# Patient Record
Sex: Female | Born: 1990 | Race: Black or African American | Hispanic: No | Marital: Single | Smoking: Never smoker
Health system: Southern US, Community
[De-identification: ages and names within clinical notes are randomized; demographics above are authoritative.]

## PROBLEM LIST (undated history)

## (undated) DIAGNOSIS — J45909 Unspecified asthma, uncomplicated: Secondary | ICD-10-CM

## (undated) DIAGNOSIS — N946 Dysmenorrhea, unspecified: Secondary | ICD-10-CM

## (undated) DIAGNOSIS — F319 Bipolar disorder, unspecified: Secondary | ICD-10-CM

## (undated) DIAGNOSIS — F32A Depression, unspecified: Secondary | ICD-10-CM

## (undated) HISTORY — DX: Depression, unspecified: F32.A

## (undated) HISTORY — DX: Dysmenorrhea, unspecified: N94.6

## (undated) HISTORY — PX: OTHER SURGICAL HISTORY: SHX169

## (undated) HISTORY — DX: Bipolar disorder, unspecified: F31.9

## (undated) HISTORY — DX: Unspecified asthma, uncomplicated: J45.909

---

## 2020-11-12 ENCOUNTER — Encounter: Payer: Self-pay | Admitting: Neurology

## 2020-12-08 ENCOUNTER — Ambulatory Visit: Payer: Self-pay | Admitting: Neurology

## 2020-12-31 ENCOUNTER — Ambulatory Visit: Payer: Medicaid Other | Admitting: Neurology

## 2021-01-03 ENCOUNTER — Ambulatory Visit: Payer: Medicaid Other | Admitting: Podiatry

## 2021-01-12 ENCOUNTER — Ambulatory Visit: Payer: Medicaid Other | Admitting: Podiatry

## 2021-01-17 ENCOUNTER — Ambulatory Visit: Payer: Medicaid Other | Admitting: Neurology

## 2021-01-20 ENCOUNTER — Ambulatory Visit (INDEPENDENT_AMBULATORY_CARE_PROVIDER_SITE_OTHER): Payer: Medicaid Other | Admitting: Neurology

## 2021-01-20 ENCOUNTER — Other Ambulatory Visit: Payer: Self-pay

## 2021-01-20 ENCOUNTER — Encounter: Payer: Self-pay | Admitting: Neurology

## 2021-01-20 VITALS — BP 100/70 | HR 78 | Ht 64.0 in | Wt 240.0 lb

## 2021-01-20 DIAGNOSIS — R4689 Other symptoms and signs involving appearance and behavior: Secondary | ICD-10-CM | POA: Diagnosis not present

## 2021-01-20 DIAGNOSIS — R404 Transient alteration of awareness: Secondary | ICD-10-CM

## 2021-01-20 DIAGNOSIS — R292 Abnormal reflex: Secondary | ICD-10-CM | POA: Diagnosis not present
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
## 2021-01-20 NOTE — Patient Instructions (Signed)
Good to meet you!  Schedule head CT without contrast  2. Schedule EEG  3. Our office will call with results, if normal, follow-up as needed

## 2021-01-31 ENCOUNTER — Other Ambulatory Visit: Payer: Self-pay

## 2021-01-31 ENCOUNTER — Ambulatory Visit: Payer: Medicaid Other

## 2021-01-31 ENCOUNTER — Ambulatory Visit (INDEPENDENT_AMBULATORY_CARE_PROVIDER_SITE_OTHER): Payer: Medicaid Other | Admitting: Podiatry

## 2021-01-31 ENCOUNTER — Ambulatory Visit (INDEPENDENT_AMBULATORY_CARE_PROVIDER_SITE_OTHER): Payer: Medicaid Other

## 2021-01-31 DIAGNOSIS — M7750 Other enthesopathy of unspecified foot: Secondary | ICD-10-CM

## 2021-01-31 DIAGNOSIS — R2241 Localized swelling, mass and lump, right lower limb: Secondary | ICD-10-CM | POA: Diagnosis not present

## 2021-01-31 NOTE — Progress Notes (Signed)
  Subjective:  Patient ID: Kelly Schroeder, female    DOB: [birth year],  MRN: [id number]  Chief Complaint  Patient presents with   Foot Pain    New pt- mass of right foot    30 y.o. female presents with the above complaint. History confirmed with patient.  She presents today for evaluation of a mass on the right foot, referred by her PCP feels like a bone spurs.  She is here with a caregiver.  Patient is a relatively poor historian.  She says it hurts with or without shoes  Objective:  Physical Exam: warm, good capillary refill, no trophic changes or ulcerative lesions, normal DP and PT pulses, and normal sensory exam. Left Foot: normal exam, no swelling, tenderness, instability; ligaments intact, full range of motion of all ankle/foot joints Right Foot: Painful hard mobile lesion over the extensor tendon to the fifth toe on the MTPJ level  No images are attached to the encounter.  Radiographs: Multiple views x-ray of the right foot: Increased radiodensity at the level of the mass as noted by skin marker Assessment:   1. Mass of right foot      Plan:  Patient was evaluated and treated and all questions answered.  Mass is quite firm not sure that this is actually a ganglion cyst.  I recommend imaging with an MRI to evaluate for possible surgical intervention.  Differential also includes a schwannoma or neurovascular tumor.  She will follow-up after the MRI for reevaluation and planning  Return for after MRI to review (Call to schedule).

## 2021-02-11 ENCOUNTER — Ambulatory Visit: Payer: Medicaid Other

## 2021-02-16 ENCOUNTER — Ambulatory Visit (INDEPENDENT_AMBULATORY_CARE_PROVIDER_SITE_OTHER): Payer: Medicaid Other | Admitting: Neurology

## 2021-02-16 ENCOUNTER — Other Ambulatory Visit: Payer: Self-pay

## 2021-02-16 DIAGNOSIS — R292 Abnormal reflex: Secondary | ICD-10-CM | POA: Diagnosis not present

## 2021-02-16 DIAGNOSIS — R4689 Other symptoms and signs involving appearance and behavior: Secondary | ICD-10-CM

## 2021-02-16 DIAGNOSIS — R404 Transient alteration of awareness: Secondary | ICD-10-CM

## 2021-02-18 ENCOUNTER — Ambulatory Visit
Admission: RE | Admit: 2021-02-18 | Discharge: 2021-02-18 | Disposition: A | Discharge status: Home / Self Care | Payer: Medicaid Other | Source: Ambulatory Visit | Attending: Neurology | Admitting: Neurology

## 2021-02-18 ENCOUNTER — Other Ambulatory Visit: Payer: Self-pay

## 2021-02-18 DIAGNOSIS — R404 Transient alteration of awareness: Secondary | ICD-10-CM

## 2021-02-18 DIAGNOSIS — R292 Abnormal reflex: Secondary | ICD-10-CM

## 2021-02-18 DIAGNOSIS — R4689 Other symptoms and signs involving appearance and behavior: Secondary | ICD-10-CM

## 2021-02-21 ENCOUNTER — Encounter
Admission: RE | Admit: 2021-02-21 | Discharge: 2021-02-21 | Disposition: A | Discharge status: Home / Self Care | Payer: Medicaid Other | Source: Ambulatory Visit | Attending: Podiatry | Admitting: Podiatry

## 2021-02-21 ENCOUNTER — Other Ambulatory Visit: Payer: Self-pay

## 2021-02-21 ENCOUNTER — Telehealth: Payer: Self-pay

## 2021-02-21 DIAGNOSIS — R2241 Localized swelling, mass and lump, right lower limb: Secondary | ICD-10-CM | POA: Insufficient documentation

## 2021-02-21 MED ORDER — GADOBUTROL 1 MMOL/ML IV SOLN
10.0000 mL | Freq: Once | INTRAVENOUS | Status: AC | PRN
  Administered 2021-02-21: 10 mL via INTRAVENOUS

## 2021-02-21 NOTE — Telephone Encounter (Signed)
Spoke with pt caregiver they asked for pt CT report to faxed to [phone] to [facility]

## 2021-02-21 NOTE — Telephone Encounter (Signed)
-----   Message from Alda Berthold, DO sent at 02/18/2021 12:16 PM EST ----- Please inform pt that CT head does not show any abnormalities to explain symptoms.

## 2021-03-03 ENCOUNTER — Ambulatory Visit: Payer: Medicaid Other | Admitting: Podiatry

## 2021-03-08 NOTE — Procedures (Signed)
ELECTROENCEPHALOGRAM REPORT  Date of Study: 02/16/2021  Patient's Name: Kelly Schroeder MRN: [id number] Date of Birth: 27-Sep-1990  Referring Provider: Dr. Ellouise Newer  Clinical History: This is a 30 year old woman with staring spells and behavioral changes, mother concerned they are seizures. EEG for classification.  Medications: VENTOLIN HFA 108 (90 Base) MCG/ACT inhaler NEXPLANON 68 MG IMPL implant ABILIFY 2 MG tablet ADVIL 800 MG tablet CLARITIN 10 MG tablet DESYREL 50 MG tablet  Technical Summary: A multichannel digital EEG recording measured by the international 10-20 system with electrodes applied with paste and impedances below 5000 ohms performed in our laboratory with EKG monitoring in an awake and asleep patient.  Hyperventilation was not performed. Photic stimulation was performed.  The digital EEG was referentially recorded, reformatted, and digitally filtered in a variety of bipolar and referential montages for optimal display.    Description: The patient is awake and asleep during the recording.  During maximal wakefulness, there is a symmetric, medium voltage 8 Hz posterior dominant rhythm that attenuates with eye opening.  The record is symmetric.  During drowsiness and stage I sleep, there is an increase in theta slowing of the background with vertex waves seen.  Photic stimulation did not elicit any abnormalities.  There were no epileptiform discharges or electrographic seizures seen.    EKG lead was unremarkable.  Impression: This awake and asleep EEG is normal.    Clinical Correlation: A normal EEG does not exclude a clinical diagnosis of epilepsy.  If further clinical questions remain, prolonged EEG may be helpful.  Clinical correlation is advised.   Ellouise Newer, M.D.

## 2021-03-16 ENCOUNTER — Encounter: Payer: Self-pay | Admitting: Podiatry

## 2021-03-16 ENCOUNTER — Ambulatory Visit: Payer: Medicaid Other | Admitting: Podiatry

## 2021-03-16 ENCOUNTER — Other Ambulatory Visit: Payer: Self-pay

## 2021-03-16 DIAGNOSIS — D492 Neoplasm of unspecified behavior of bone, soft tissue, and skin: Secondary | ICD-10-CM

## 2021-03-16 DIAGNOSIS — D3610 Benign neoplasm of peripheral nerves and autonomic nervous system, unspecified: Secondary | ICD-10-CM

## 2021-03-18 ENCOUNTER — Telehealth: Payer: Self-pay

## 2021-03-18 NOTE — Telephone Encounter (Signed)
-----   Message from Cameron Sprang, MD sent at 03/17/2021  9:54 AM EST ----- Pls let caregiver know the brainwave test was normal, thanks

## 2021-03-18 NOTE — Telephone Encounter (Signed)
Pt caregiver called no answer per DPR left a voice mail  the brainwave test was normal

## 2021-03-21 NOTE — Progress Notes (Signed)
°  Subjective:  Patient ID: Kelly Schroeder, female    DOB: [birth year],  MRN: [id number]  Chief Complaint  Patient presents with   Mass    Right foot   mri follow up    31 y.o. female presents with the above complaint. History confirmed with patient.  She presents today for evaluation of a mass on the right foot, referred by her PCP feels like a bone spurs.  She is here with a caregiver.  Patient is a relatively poor historian.  She says it hurts with or without shoes   Interval history: She completed the MRI and is here for review Objective:  Physical Exam: warm, good capillary refill, no trophic changes or ulcerative lesions, normal DP and PT pulses, and normal sensory exam. Left Foot: normal exam, no swelling, tenderness, instability; ligaments intact, full range of motion of all ankle/foot joints Right Foot: Painful hard mobile lesion over the extensor tendon to the fifth toe on the MTPJ level  No images are attached to the encounter.  Radiographs: Multiple views x-ray of the right foot: Increased radiodensity at the level of the mass as noted by skin marker  MRI right foot 02/22/2021 IMPRESSION: 1. Smoothly marginated and well circumscribed 19.5 x 13.0 x 10.0 mm solid enhancing subcutaneous soft tissue mass overlying the dorsal aspect of the fourth metatarsal shaft. This most likely reflects a nerve sheath tumor (schwannoma or neurofibroma). No involvement of the underlying foot musculature or fourth metatarsal cortex. 2. No other significant findings.   Assessment:   1. Benign tumor of peripheral, sympathetic, and parasympathetic nerves and ganglia      Plan:  Patient was evaluated and treated and all questions answered.  MRI confirms the diagnosis is likely a peripheral nerve tumor.  I discussed multiple treatment options including nonsurgical treatment with just monitoring it and living with the condition, or excisional biopsy.  Seems to be symptomatic for her and  limiting her ADLs.  We discussed the option of excision.  The caregiver with her today is not her legal guardian, they will return for follow-up when her legal guardian is able and plan for surgical excision.  Return for surgery planning visit .

## 2021-03-28 ENCOUNTER — Ambulatory Visit: Payer: Medicaid Other | Admitting: Podiatry

## 2021-03-30 ENCOUNTER — Encounter: Payer: Self-pay | Admitting: Podiatry

## 2021-03-30 ENCOUNTER — Ambulatory Visit (INDEPENDENT_AMBULATORY_CARE_PROVIDER_SITE_OTHER): Payer: Medicaid Other | Admitting: Podiatry

## 2021-03-30 ENCOUNTER — Other Ambulatory Visit: Payer: Self-pay

## 2021-03-30 DIAGNOSIS — D3610 Benign neoplasm of peripheral nerves and autonomic nervous system, unspecified: Secondary | ICD-10-CM

## 2021-03-30 DIAGNOSIS — D492 Neoplasm of unspecified behavior of bone, soft tissue, and skin: Secondary | ICD-10-CM | POA: Diagnosis not present

## 2021-03-30 NOTE — Progress Notes (Signed)
°  Subjective:  Patient ID: Kelly Schroeder, female    DOB: 1990/10/25,  MRN: [id number]  Chief Complaint  Patient presents with   Foot Pain    "We're here to discuss surgery."    31 y.o. female presents with the above complaint. History confirmed with patient.  She presents today for evaluation of a mass on the right foot, referred by her PCP feels like a bone spurs.  She is here with a caregiver.  Patient is a relatively poor historian.  She says it hurts with or without shoes   Interval history: She returns today for surgical planning visit with her legal guardian Objective:  Physical Exam: warm, good capillary refill, no trophic changes or ulcerative lesions, normal DP and PT pulses, and normal sensory exam. Left Foot: normal exam, no swelling, tenderness, instability; ligaments intact, full range of motion of all ankle/foot joints Right Foot: Painful hard mobile lesion over the extensor tendon to the fifth toe on the MTPJ level  No images are attached to the encounter.  Radiographs: Multiple views x-ray of the right foot: Increased radiodensity at the level of the mass as noted by skin marker  MRI right foot 02/22/2021 IMPRESSION: 1. Smoothly marginated and well circumscribed 19.5 x 13.0 x 10.0 mm solid enhancing subcutaneous soft tissue mass overlying the dorsal aspect of the fourth metatarsal shaft. This most likely reflects a nerve sheath tumor (schwannoma or neurofibroma). No involvement of the underlying foot musculature or fourth metatarsal cortex. 2. No other significant findings.   Assessment:   1. Benign tumor of peripheral, sympathetic, and parasympathetic nerves and ganglia      Plan:  Patient was evaluated and treated and all questions answered.  Again discussed the proposed surgery of excision of the painful tumor or mass with her caregiver as well as legal guardian is present today.  We discussed the risk benefits and potential complications of the  surgery including but not limited to pain, swelling, infection, scar, numbness which may be temporary or permanent, chronic pain, stiffness, nerve pain or damage, wound healing problems.  I also discussed this with the patient and she indicated understanding.  The goal of surgery would be to remove the mass and improve pain and function and obtain a biopsy of the specimen to elucidate if the mass is indeed benign which it appears to be on her MRI.  They understand and wish to proceed.  No guarantees were given.  Informed consent was signed and reviewed.  Surgery will be scheduled at a mutually agreeable date.   Surgical plan:  Procedure: -Excision of mass of right foot  Location: -[facility]  Anesthesia plan: -IV sedation with local field block  Postoperative pain plan: -Percocet 5 mg / 325 mg every 6 hours as needed, ibuprofen 600 mg every 6 hours  DVT prophylaxis: -None required  WB Restrictions / DME needs: -WBAT in surgical shoe that we will dispense at surgery center   Return for after surgery .

## 2021-05-18 ENCOUNTER — Telehealth: Payer: Self-pay

## 2021-05-18 NOTE — Telephone Encounter (Signed)
Received a call from J[place] legal guardian. She stated Kelly Schroeder has a UTI and was out on antibiotics 05/17/2021. We have rescheduled her surgery with Dr. Sherryle Lis from 05/20/2021 to 06/03/2021. Notified Dr. Sherryle Lis and Caren Griffins with [place] ?

## 2021-05-25 ENCOUNTER — Encounter: Payer: Medicaid Other | Admitting: Podiatry

## 2021-06-03 ENCOUNTER — Other Ambulatory Visit: Payer: Self-pay | Admitting: Podiatry

## 2021-06-03 DIAGNOSIS — D3610 Benign neoplasm of peripheral nerves and autonomic nervous system, unspecified: Secondary | ICD-10-CM

## 2021-06-03 DIAGNOSIS — D492 Neoplasm of unspecified behavior of bone, soft tissue, and skin: Secondary | ICD-10-CM | POA: Diagnosis not present

## 2021-06-03 DIAGNOSIS — G5761 Lesion of plantar nerve, right lower limb: Secondary | ICD-10-CM | POA: Diagnosis not present

## 2021-06-03 MED ORDER — IBUPROFEN 600 MG PO TABS: 600.0000 mg | ORAL_TABLET | Freq: Four times a day (QID) | ORAL | 0 refills | Status: AC | PRN

## 2021-06-03 MED ORDER — GABAPENTIN 300 MG PO CAPS: 300.0000 mg | ORAL_CAPSULE | Freq: Three times a day (TID) | ORAL | 0 refills | Status: DC

## 2021-06-03 MED ORDER — ACETAMINOPHEN 500 MG PO TABS: 1000.0000 mg | ORAL_TABLET | Freq: Four times a day (QID) | ORAL | 0 refills | Status: AC | PRN

## 2021-06-03 MED ORDER — OXYCODONE HCL 5 MG PO TABS: 5.0000 mg | ORAL_TABLET | ORAL | 0 refills | Status: AC | PRN

## 2021-06-03 NOTE — Progress Notes (Signed)
06/03/21 excision nerve tumor  ?

## 2021-06-08 ENCOUNTER — Other Ambulatory Visit: Payer: Self-pay

## 2021-06-08 ENCOUNTER — Ambulatory Visit (INDEPENDENT_AMBULATORY_CARE_PROVIDER_SITE_OTHER): Payer: Medicaid Other | Admitting: Podiatry

## 2021-06-08 ENCOUNTER — Encounter: Payer: Medicaid Other | Admitting: Podiatry

## 2021-06-08 ENCOUNTER — Telehealth: Payer: Self-pay | Admitting: Podiatry

## 2021-06-08 DIAGNOSIS — D3610 Benign neoplasm of peripheral nerves and autonomic nervous system, unspecified: Secondary | ICD-10-CM

## 2021-06-08 NOTE — Telephone Encounter (Signed)
Patients care giver called and needs to know when patient can resume going back to [place]?  Can she go back now or wait until after her next follow up appointment?  She will need a note to give to them with return date and any restrictions.

## 2021-06-08 NOTE — Progress Notes (Signed)
?  Subjective:  ?Patient ID: Kelly Schroeder, female    DOB: 01-28-1991,  MRN: [id number] ? ?Chief Complaint  ?Patient presents with  ? Routine Post Op  ?    POV #1 DOS 06/03/2021 EXCISION OF MASS OF RT FOOT  ? ? ?31 y.o. female returns for post-op check.  Doing well not having much pain ? ?Review of Systems: Negative except as noted in the HPI. Denies N/V/F/Ch. ? ? ?Objective:  ?There were no vitals filed for this visit. ?There is no height or weight on file to calculate BMI. ?Constitutional Well developed. ?Well nourished.  ?Vascular Foot warm and well perfused. ?Capillary refill normal to all digits.  Calf is soft and supple, no posterior calf or knee pain, negative Homans' sign  ?Neurologic Normal speech. ?Oriented to person, place, and time. ?Epicritic sensation to light touch grossly present bilaterally.  ?Dermatologic Skin healing well without signs of infection. Skin edges well coapted without signs of infection.  ?Orthopedic: Tenderness to palpation noted about the surgical site.  ? ? ?Assessment:  ? ?1. Benign tumor of peripheral, sympathetic, and parasympathetic nerves and ganglia   ? ?Plan:  ?Patient was evaluated and treated and all questions answered. ? ?S/p foot surgery right ?-Progressing as expected post-operatively. ?-WB Status: WBAT in surgical shoe ?-Sutures: Removed next visit. ?-Medications: No refills required ?-Foot redressed. ? ?Return in about 2 weeks (around 06/22/2021) for post op (no x-rays), suture removal.  ?

## 2021-06-13 ENCOUNTER — Encounter: Payer: Self-pay | Admitting: Podiatry

## 2021-06-20 ENCOUNTER — Encounter: Payer: Self-pay | Admitting: *Deleted

## 2021-06-20 ENCOUNTER — Ambulatory Visit (INDEPENDENT_AMBULATORY_CARE_PROVIDER_SITE_OTHER): Payer: Medicaid Other | Admitting: Podiatry

## 2021-06-20 ENCOUNTER — Encounter: Payer: Medicaid Other | Admitting: Podiatry

## 2021-06-20 DIAGNOSIS — D3613 Benign neoplasm of peripheral nerves and autonomic nervous system of lower limb, including hip: Secondary | ICD-10-CM

## 2021-06-25 NOTE — Progress Notes (Signed)
?  Subjective:  ?Patient ID: Kelly Schroeder, female    DOB: 07-07-1990,  MRN: [id number] ? ?Chief Complaint  ?Patient presents with  ? Routine Post Op  ?    POV #2 DOS 06/03/2021 EXCISION OF MASS OF RT FOOT / SUTURE REMOVAL  ? ? ?31 y.o. female returns for post-op check.  Doing well not having much pain ? ?Review of Systems: Negative except as noted in the HPI. Denies N/V/F/Ch. ? ? ?Objective:  ?There were no vitals filed for this visit. ?There is no height or weight on file to calculate BMI. ?Constitutional Well developed. ?Well nourished.  ?Vascular Foot warm and well perfused. ?Capillary refill normal to all digits.  Calf is soft and supple, no posterior calf or knee pain, negative Homans' sign  ?Neurologic Normal speech. ?Oriented to person, place, and time. ?Epicritic sensation to light touch grossly present bilaterally.  ?Dermatologic Skin healing well without signs of infection. Skin edges well coapted without signs of infection.  ?Orthopedic: Tenderness to palpation noted about the surgical site.  ? ?Pathology results with schwannoma with complete excision ? ?Assessment:  ? 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 ?Plan:  ?Patient was evaluated and treated and all questions answered. ? ?S/p foot surgery right ?-Sutures removed today.  Pathology results show it was a schwannoma.  Hopefully does not recur.  Discussed with him I do expect she will have numbness here for some time.  Likely forever.  Return to regular shoe gear and activities as tolerated.  Return as needed ? ?Return if symptoms worsen or fail to improve.  ?

## 2021-06-29 ENCOUNTER — Encounter: Payer: Medicaid Other | Admitting: Podiatry

## 2021-07-13 ENCOUNTER — Encounter: Payer: Medicaid Other | Admitting: Podiatry

## 2021-09-06 ENCOUNTER — Telehealth: Payer: Self-pay | Admitting: Obstetrics and Gynecology

## 2021-09-06 NOTE — Telephone Encounter (Signed)
Called and left voicemail for patient to call back to be scheduled. 

## 2021-09-06 NOTE — Telephone Encounter (Signed)
[facility] referring for Having dysmennorhea. bleeding "all the time" has nexplanon in place since 2021. Paper records. Sch any providers.

## 2021-09-13 ENCOUNTER — Encounter: Payer: Self-pay | Admitting: Obstetrics

## 2021-09-13 ENCOUNTER — Other Ambulatory Visit (HOSPITAL_COMMUNITY)
Admission: RE | Admit: 2021-09-13 | Discharge: 2021-09-13 | Disposition: A | Discharge status: Home / Self Care | Payer: Medicaid Other | Source: Ambulatory Visit | Attending: Obstetrics | Admitting: Obstetrics

## 2021-09-13 ENCOUNTER — Ambulatory Visit (INDEPENDENT_AMBULATORY_CARE_PROVIDER_SITE_OTHER): Payer: Medicaid Other | Admitting: Obstetrics

## 2021-09-13 VITALS — BP 122/80 | Wt 244.8 lb

## 2021-09-13 DIAGNOSIS — Z124 Encounter for screening for malignant neoplasm of cervix: Secondary | ICD-10-CM

## 2021-09-13 DIAGNOSIS — Z975 Presence of (intrauterine) contraceptive device: Secondary | ICD-10-CM

## 2021-09-13 DIAGNOSIS — Z113 Encounter for screening for infections with a predominantly sexual mode of transmission: Secondary | ICD-10-CM | POA: Diagnosis not present

## 2021-09-13 DIAGNOSIS — Z6841 Body Mass Index (BMI) 40.0 and over, adult: Secondary | ICD-10-CM

## 2021-09-13 DIAGNOSIS — N921 Excessive and frequent menstruation with irregular cycle: Secondary | ICD-10-CM | POA: Diagnosis not present

## 2021-09-13 DIAGNOSIS — R1031 Right lower quadrant pain: Secondary | ICD-10-CM

## 2021-09-13 DIAGNOSIS — Z1151 Encounter for screening for human papillomavirus (HPV): Secondary | ICD-10-CM | POA: Insufficient documentation

## 2021-09-13 MED ORDER — LO LOESTRIN FE 1 MG-10 MCG / 10 MCG PO TABS: 1.0000 | ORAL_TABLET | Freq: Every day | ORAL | 0 refills | Status: DC

## 2021-09-15 LAB — CYTOLOGY - PAP
Comment: NEGATIVE
Diagnosis: NEGATIVE
High risk HPV: NEGATIVE

## 2021-09-17 LAB — NUSWAB VAGINITIS PLUS (VG+)
Candida albicans, NAA: NEGATIVE
Candida glabrata, NAA: NEGATIVE
Chlamydia trachomatis, NAA: NEGATIVE
Neisseria gonorrhoeae, NAA: NEGATIVE
Trich vag by NAA: NEGATIVE

## 2021-09-26 ENCOUNTER — Ambulatory Visit
Admission: RE | Admit: 2021-09-26 | Discharge: 2021-09-26 | Disposition: A | Discharge status: Home / Self Care | Payer: Medicaid Other | Source: Ambulatory Visit | Attending: Obstetrics | Admitting: Obstetrics

## 2021-09-26 ENCOUNTER — Other Ambulatory Visit: Payer: Self-pay | Admitting: Obstetrics

## 2021-09-26 DIAGNOSIS — R1031 Right lower quadrant pain: Secondary | ICD-10-CM | POA: Insufficient documentation

## 2021-09-26 DIAGNOSIS — N921 Excessive and frequent menstruation with irregular cycle: Secondary | ICD-10-CM | POA: Insufficient documentation

## 2021-09-26 DIAGNOSIS — Z975 Presence of (intrauterine) contraceptive device: Secondary | ICD-10-CM | POA: Diagnosis present

## 2021-09-30 ENCOUNTER — Ambulatory Visit (INDEPENDENT_AMBULATORY_CARE_PROVIDER_SITE_OTHER): Payer: Medicaid Other | Admitting: Family Medicine

## 2021-09-30 ENCOUNTER — Encounter: Payer: Self-pay | Admitting: Family Medicine

## 2021-09-30 VITALS — BP 132/82 | Wt 243.0 lb

## 2021-09-30 DIAGNOSIS — D259 Leiomyoma of uterus, unspecified: Secondary | ICD-10-CM

## 2021-09-30 DIAGNOSIS — Z975 Presence of (intrauterine) contraceptive device: Secondary | ICD-10-CM

## 2021-09-30 DIAGNOSIS — N921 Excessive and frequent menstruation with irregular cycle: Secondary | ICD-10-CM

## 2021-09-30 MED ORDER — MULTIVITAMIN WOMEN PO TABS: 1.0000 | ORAL_TABLET | Freq: Every day | ORAL | 12 refills | Status: DC

## 2021-09-30 MED ORDER — LO LOESTRIN FE 1 MG-10 MCG / 10 MCG PO TABS: 1.0000 | ORAL_TABLET | Freq: Every day | ORAL | 2 refills | Status: DC

## 2021-09-30 NOTE — Progress Notes (Unsigned)
   GYNECOLOGY PROBLEM  VISIT ENCOUNTER NOTE  Subjective:   Telicia Hodgkiss is a 31 y.o. G0P0000 female here for a problem GYN visit.  Current complaints: here to follow [place].   Denies abnormal vaginal bleeding, discharge, pelvic pain, problems with intercourse or other gynecologic concerns.    Gynecologic History No LMP recorded. Patient has had an implant.  Contraception: Nexplanon  Health Maintenance Due  Topic Date Due   COVID-19 Vaccine (1) Never done   HIV Screening  Never done   Hepatitis C Screening  Never done   TETANUS/TDAP  06/12/2017    The following portions of the patient's history were reviewed and updated as appropriate: allergies, current medications, past family history, past medical history, past social history, past surgical history and problem list.  Review of Systems Pertinent items are noted in HPI.   Objective:  BP 132/82   Wt 243 lb (110.2 kg)   BMI 41.71 kg/m  Gen: well appearing, NAD HEENT: no scleral icterus CV: RR Lung: Normal WOB Ext: warm well perfused     Assessment and Plan:   1. Breakthrough bleeding on Nexplanon - Reviewed trial of pills to stabilize endometrium.  - Will try add back estrogen pill for 3 months. - Norethindrone-Ethinyl Estradiol-Fe Biphas (LO LOESTRIN FE) 1 MG-10 MCG / 10 MCG tablet; Take 1 tablet by mouth daily.  Dispense: 30 tablet; Refill: 2 - Multiple Vitamins-Minerals (MULTIVITAMIN WOMEN) TABS; Take 1 tablet by mouth daily.  Dispense: 30 tablet; Refill: 12  2. 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    Please refer to After Visit Summary for other counseling recommendations.   No follow-ups on file.  Caren Macadam, MD, MPH, ABFM Attending [place] for [facility]

## 2021-10-03 ENCOUNTER — Encounter: Payer: Self-pay | Admitting: Family Medicine

## 2021-12-13 ENCOUNTER — Telehealth: Payer: Self-pay

## 2021-12-13 ENCOUNTER — Ambulatory Visit (INDEPENDENT_AMBULATORY_CARE_PROVIDER_SITE_OTHER): Payer: Medicaid Other | Admitting: Obstetrics and Gynecology

## 2021-12-13 ENCOUNTER — Encounter: Payer: Self-pay | Admitting: Obstetrics and Gynecology

## 2021-12-13 VITALS — BP 100/80 | Ht 65.0 in | Wt 246.0 lb

## 2021-12-13 DIAGNOSIS — Z975 Presence of (intrauterine) contraceptive device: Secondary | ICD-10-CM

## 2021-12-13 DIAGNOSIS — Z3041 Encounter for surveillance of contraceptive pills: Secondary | ICD-10-CM

## 2021-12-13 DIAGNOSIS — N921 Excessive and frequent menstruation with irregular cycle: Secondary | ICD-10-CM | POA: Diagnosis not present

## 2021-12-13 MED ORDER — NORETHINDRONE ACET-ETHINYL EST 1-20 MG-MCG PO TABS: 1.0000 | ORAL_TABLET | Freq: Every day | ORAL | 3 refills | Status: DC

## 2021-12-13 NOTE — Telephone Encounter (Signed)
Yes, Rx is different intentionally

## 2021-12-13 NOTE — Telephone Encounter (Signed)
[place] notified.

## 2021-12-13 NOTE — Progress Notes (Signed)
Coolidge Breeze, FNP   Chief Complaint  Patient presents with  . Follow-up    Heavy vag bleeding on nexplanon    HPI:      [name] is a 31 y.o. G0P0000 whose LMP was No LMP recorded. Patient has had an implant., presents again today for AUB with nexplanon. Nexplanon placed 10/09/19. Initially didn't have bleeding. Has had bleeding off and on past few months. Lives in group home and brought to appt by caregivers.  Seen 6/23 for bleeding, started on Lo Loestrin and GYN u/s orderd.  7/23 u/s showed 3.9 cm exophytic leio on GYN u/s 7/23, EM=3.3 mm. BTB resolved with Lo Loestrin at time of 7/23 f/u appt. Started bleeding today per caregivers (noted on diaper), no dsymen. Also had some bleeding last month. Pt is on 3rd day of new pill pack.  Hx of menometrorrhagia since menarche, used to be on depo with occas light bleeding but changed to nexplanon due to wt gain.   Patient denies history of migraine headaches with aura, uncontrolled hypertension, liver disease or blood clots. Neg pap/neg HPV DNA, neg STD testing 6/23  Past Medical History:  Diagnosis Date  . Asthma   . Bipolar 1 disorder ([place])   . Depression   . Dysmenorrhea    Past Surgical History:  Procedure Laterality Date  . OTHER SURGICAL HISTORY     Tumor right leg    History reviewed. No pertinent family history.  Social History   Socioeconomic History  . Marital status: Single    Spouse name: Not on file  . Number of children: Not on file  . Years of education: Not on file  . Highest education level: Not on file  Occupational History  . Not on file  Tobacco Use  . Smoking status: Never  . Smokeless tobacco: Never  Vaping Use  . Vaping Use: Never used  Substance and Sexual Activity  . Alcohol use: Never  . Drug use: Never  . Sexual activity: Not on file  Other Topics Concern  . Not on file  Social History Narrative   Right handed    Group home [facility]    Social  Determinants of Health   Financial Resource Strain: Not on file  Food Insecurity: Not on file  Transportation Needs: Not on file  Physical Activity: Not on file  Stress: Not on file  Social Connections: Not on file  Intimate Partner Violence: Not on file    Outpatient Medications Prior to Visit  Medication Sig Dispense Refill  . albuterol (VENTOLIN HFA) 108 (90 Base) MCG/ACT inhaler Inhale into the lungs every 6 (six) hours as needed for wheezing or shortness of breath.    . ARIPiprazole (ABILIFY) 2 MG tablet 1 tablet    . etonogestrel (NEXPLANON) 70 MG IMPL implant See admin instructions.    Marland Kitchen ibuprofen (ADVIL) 800 MG tablet 1 tablet Orally Three times a day prn for 30 days    . loratadine (CLARITIN) 10 MG tablet 1 tablet    . Multiple Vitamins-Minerals (MULTIVITAMIN WOMEN) TABS Take 1 tablet by mouth daily. 30 tablet 12  . nystatin cream (MYCOSTATIN) 1 application Externally three times a day for 7 days    . traZODone (DESYREL) 50 MG tablet Take 50 mg by mouth at bedtime.    . Norethindrone-Ethinyl Estradiol-Fe Biphas (LO LOESTRIN FE) 1 MG-10 MCG / 10 MCG tablet Take 1 tablet by mouth daily. 30 tablet 2  . gabapentin (NEURONTIN)  300 MG capsule Take 1 capsule (300 mg total) by mouth 3 (three) times daily for 7 days. 21 capsule 0   No facility-administered medications prior to visit.      ROS:  Review of Systems  Constitutional:  Negative for fever.  Gastrointestinal:  Negative for blood in stool, constipation, diarrhea, nausea and vomiting.  Genitourinary:  Positive for vaginal bleeding. Negative for dyspareunia, dysuria, flank pain, frequency, hematuria, urgency, vaginal discharge and vaginal pain.  Musculoskeletal:  Negative for back pain.  Skin:  Negative for rash.   BREAST: No symptoms   OBJECTIVE:   Vitals:  BP 100/80   Ht '5\' 5"'$  (1.651 m)   Wt 246 lb (111.6 kg)   BMI 40.94 kg/m   Physical Exam Vitals reviewed.  Constitutional:      Appearance: She is  well-developed.  Pulmonary:     Effort: Pulmonary effort is normal.  Musculoskeletal:        General: Normal range of motion.     Cervical back: Normal range of motion.  Skin:    General: Skin is warm and dry.  Neurological:     General: No focal deficit present.     Mental Status: She is alert and oriented to person, place, and time.     Cranial Nerves: No cranial nerve deficit.  Psychiatric:        Mood and Affect: Mood normal.        Behavior: Behavior normal.        Thought Content: Thought content normal.        Judgment: Judgment normal.    Assessment/Plan: Breakthrough bleeding on Nexplanon - Plan: norethindrone-ethinyl estradiol (LOESTRIN 1/20, 21,) 1-20 MG-MCG tablet; AUB improved with Lo Lo OCPs but having BTB again, few days after starting new pill pack. Question related to placebo pills. Recommended cont dosing of pills to control bleeding. To make it easier for staff, complete current pills (don't take 4 placebo pills) and then change to Loestrin 1/20 21 day pill pack for cont dosing. Rx eRxd. F/u prn. If sx persist, will try aygestin instead.  Discussed sx most likely hormonal given neg pap/STD testing/GYN u/s for bleeding.   Encounter for surveillance of contraceptive pills - Plan: norethindrone-ethinyl estradiol (LOESTRIN 1/20, 21,) 1-20 MG-MCG tablet    Meds ordered this encounter  Medications  . norethindrone-ethinyl estradiol (LOESTRIN 1/20, 21,) 1-20 MG-MCG tablet    Sig: Take 1 tablet by mouth daily. CONTINUOUS DOSING    Dispense:  63 tablet    Refill:  3    Order Specific Question:   Supervising Provider    Answer:   Rubie Maid [[id number]]      Return if symptoms worsen or fail to improve.  Romualdo Prosise B. Sanchez Hemmer, PA-C 12/13/2021 2:37 PM

## 2021-12-13 NOTE — Telephone Encounter (Signed)
Caryl Pina from [employer] calling; bc dose is different than prev order; is this correct?  [phone]

## 2021-12-19 ENCOUNTER — Ambulatory Visit: Payer: Medicaid Other | Admitting: Podiatry

## 2021-12-19 ENCOUNTER — Encounter: Payer: Self-pay | Admitting: Podiatry

## 2021-12-19 DIAGNOSIS — L600 Ingrowing nail: Secondary | ICD-10-CM | POA: Diagnosis not present

## 2021-12-19 NOTE — Patient Instructions (Signed)
If the issue returns, an in office procedure to remove the nail may be necessary.

## 2021-12-22 NOTE — Progress Notes (Signed)
  Subjective:  Patient ID: Kelly Schroeder, female    DOB: 1990/05/30,  MRN: [id number]  Chief Complaint  Patient presents with   Nail Problem    "They said has an ingrown toenail." N - ingrown toenail L - hallux rt D - couple of weeks O - gradually got worse C - sharp pain A - shoe T - nurse for Dr. Merlene Morse clipped it some    31 y.o. female presents with the above complaint. History confirmed with patient.  This is a new issue.  The schwannoma excision continues to do well  Objective:  Physical Exam: warm, good capillary refill, no trophic changes or ulcerative lesions, normal DP and PT pulses, normal sensory exam, and mild ingrowing lateral right hallux border no signs of infection or paronychia.  Assessment:   1. Ingrowing right great toenail      Plan:  Patient was evaluated and treated and all questions answered.  We reviewed treatment options for ingrowing nail.  She had a relatively mild ingrowing nail today.  Did not require permanent partial matricectomy.  We discussed the option of this in the future if it returns or worsens.  Would require her POA to be present for consent signature.  Return if symptoms worsen or fail to improve.

## 2022-01-26 ENCOUNTER — Telehealth: Payer: Self-pay

## 2022-01-26 NOTE — Telephone Encounter (Signed)
[facility] called for pt reporting bleeding after being on BC pills. Then she states she remember the Dr said Tito Dine may be one of the pts that still has a period while on [facility]. She seem to not be worried and would call us if any concerns.

## 2022-06-06 ENCOUNTER — Other Ambulatory Visit: Payer: Self-pay | Admitting: Family Medicine

## 2022-06-06 DIAGNOSIS — R7989 Other specified abnormal findings of blood chemistry: Secondary | ICD-10-CM

## 2022-06-06 DIAGNOSIS — R319 Hematuria, unspecified: Secondary | ICD-10-CM

## 2022-06-14 ENCOUNTER — Ambulatory Visit
Admission: RE | Admit: 2022-06-14 | Discharge: 2022-06-14 | Disposition: A | Discharge status: Home / Self Care | Payer: Medicaid Other | Source: Ambulatory Visit | Attending: Family Medicine | Admitting: Family Medicine

## 2022-06-14 DIAGNOSIS — R7989 Other specified abnormal findings of blood chemistry: Secondary | ICD-10-CM

## 2022-06-14 DIAGNOSIS — R319 Hematuria, unspecified: Secondary | ICD-10-CM | POA: Diagnosis not present

## 2022-09-07 ENCOUNTER — Other Ambulatory Visit: Payer: Self-pay | Admitting: Obstetrics and Gynecology

## 2022-09-07 DIAGNOSIS — Z3041 Encounter for surveillance of contraceptive pills: Secondary | ICD-10-CM

## 2022-09-07 DIAGNOSIS — N921 Excessive and frequent menstruation with irregular cycle: Secondary | ICD-10-CM

## 2022-10-17 ENCOUNTER — Other Ambulatory Visit: Payer: Self-pay | Admitting: Family Medicine

## 2023-04-12 IMAGING — MR MR FOOT*R* WO/W CM
9 series · 40 of 40 positions shown · IV contrast (gadavist)
Comparison: Radiographs 01/31/2021

CLINICAL DATA: Right foot mass for 3 months.

EXAM:
MRI OF THE RIGHT FOREFOOT WITHOUT AND WITH CONTRAST
TECHNIQUE: Multiplanar, multisequence MR imaging of the right foot was
performed before and after the administration of intravenous
contrast.
CONTRAST:  10mL GADAVIST GADOBUTROL 1 MMOL/ML IV SOLN

[Series 4: T1 · coronal · right · 3.0mm · 0.38mm/px · 5 of 45 slices shown (1 of 2)]
[im 1/45]
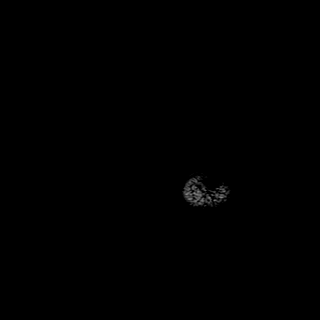
[im 12/45]
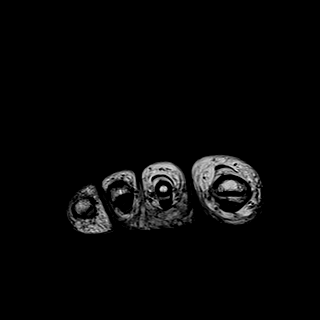
[im 23/45]
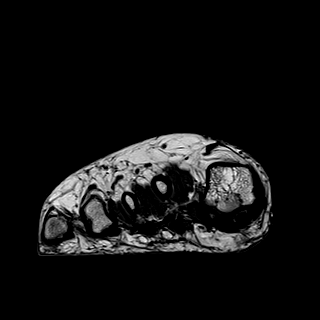
[im 34/45]
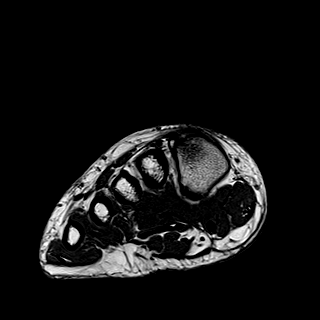
[im 45/45]
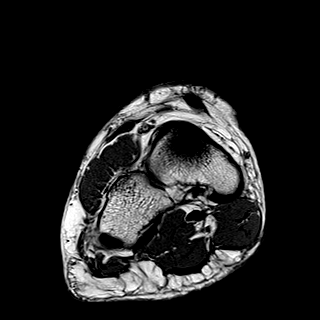

[Series 6: T2 · coronal · right · 3.0mm · 0.50mm/px · 6 of 45 slices shown (1 of 2)]
[im 1/45]
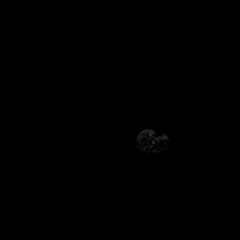
[im 9/45]
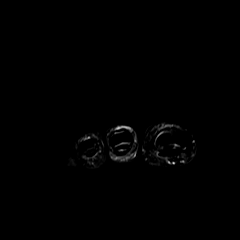
[im 18/45]
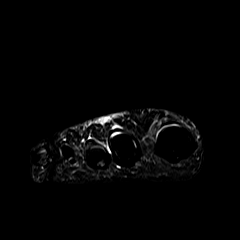
[im 27/45]
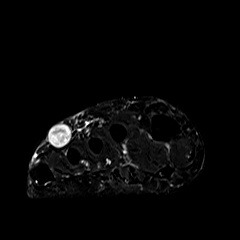
[im 36/45]
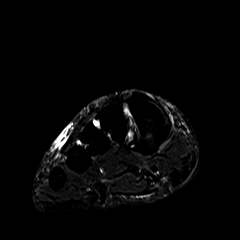
[im 45/45]
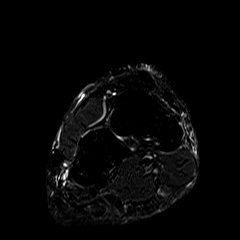

[Series 7: T1 · axial · right · 3.0mm · 0.70mm/px · z∈[-123,-51]mm · 3 of 20 slices shown (2 of 2)]
[im 1/20]
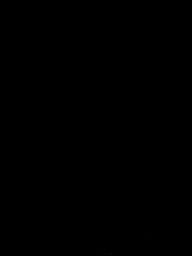
[im 10/20]
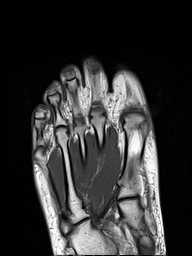
[im 20/20]
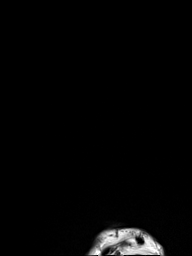

[Series 9: T2 · axial · right · 3.0mm · 0.70mm/px · z∈[-119,-51]mm · 3 of 19 slices shown (2 of 2)]
[im 1/19]
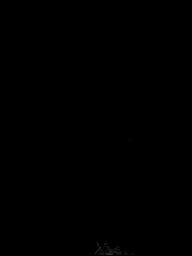
[im 10/19]
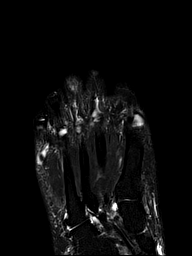
[im 19/19]
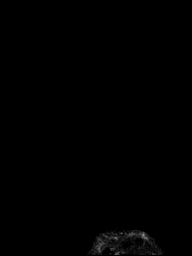

[Series 10: STIR · sagittal · right · 3.0mm · 0.62mm/px · 4 of 29 slices shown]
[im 1/29]
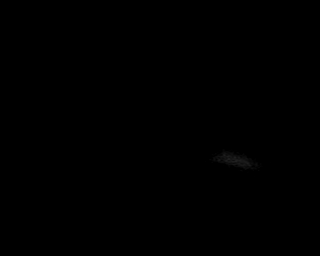
[im 10/29]
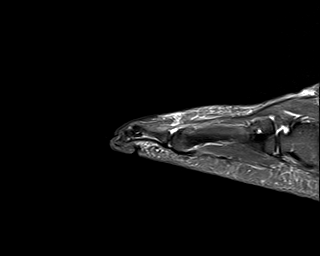
[im 19/29]
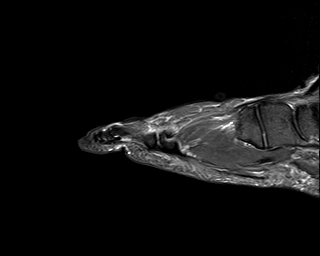
[im 29/29]
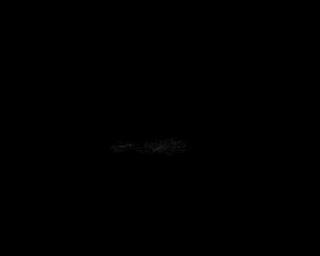

[Series 11: T1 fat-sat · coronal · non-contrast · right · 3.0mm · 0.47mm/px · 6 of 45 slices shown (1 of 3)]
[im 1/45]
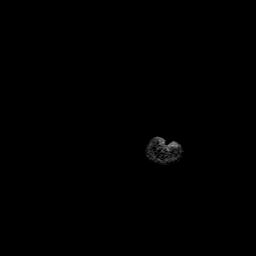
[im 9/45]
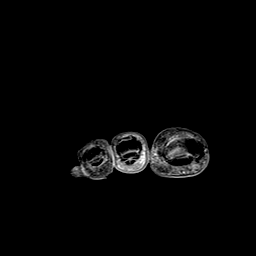
[im 18/45]
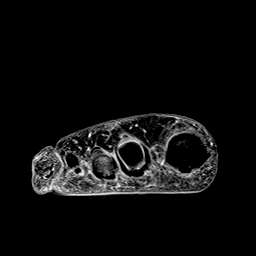
[im 27/45]
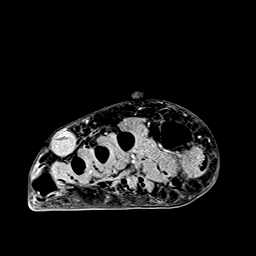
[im 36/45]
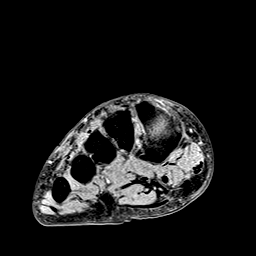
[im 45/45]
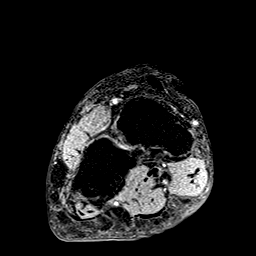

[Series 13: T1 fat-sat · sagittal · right · 3.0mm · 0.62mm/px · 4 of 28 slices shown (2 of 3)]
[im 1/28]
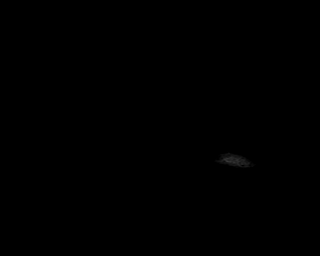
[im 10/28]
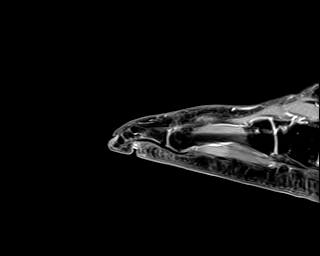
[im 19/28]
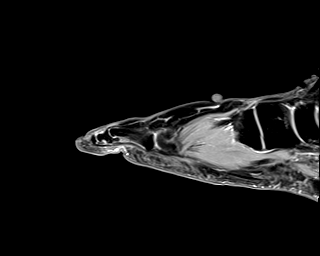
[im 28/28]
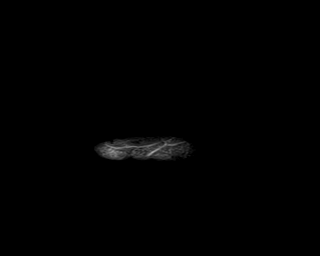

[Series 14: T1 fat-sat · axial · right · 3.0mm · 0.56mm/px · z∈[-123,-51]mm · 3 of 20 slices shown (3 of 3)]
[im 1/20]
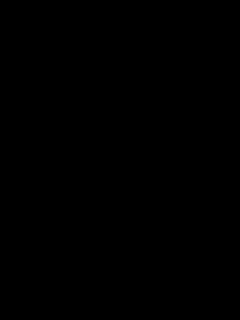
[im 10/20]
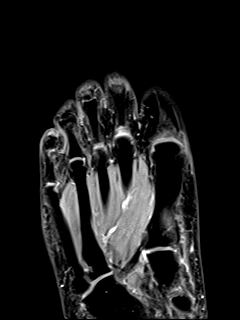
[im 20/20]
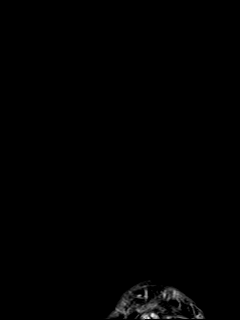

[Series 15: T1 fat-sat post-contrast · coronal · right · 3.0mm · 0.47mm/px · 6 of 45 slices shown]
[im 1/45]
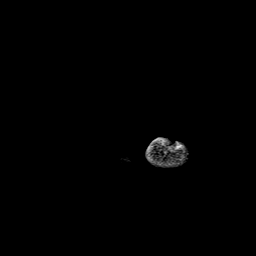
[im 9/45]
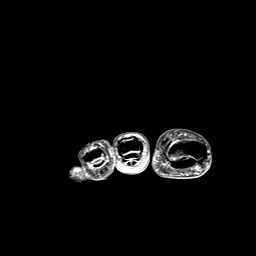
[im 18/45]
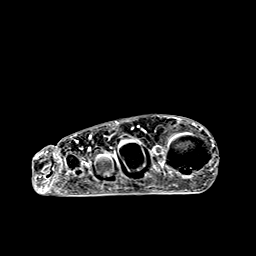
[im 27/45]
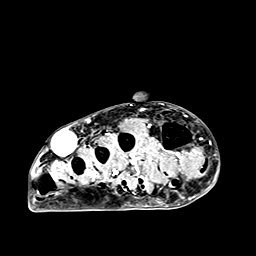
[im 36/45]
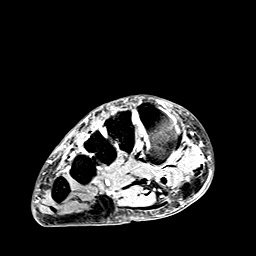
[im 45/45]
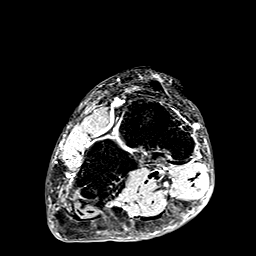

[40 of 40 positions shown; findings below may reference images not displayed]

FINDINGS: There is a solid enhancing subcutaneous soft tissue mass overlying
the dorsal aspect of the fourth metatarsal shaft. This measures
x 13.0 x 10.0 mm. It has low T1 and heterogeneous high T2 signal
intensity with subsequent fairly uniform and marked contrast
enhancement. The lesion is smoothly marginated and well
circumscribed and most likely reflects a nerve sheath tumor
(schwannoma or neurofibroma). No large feeding vessels to suggest an
aneurysm or hemangioma. No surrounding inflammatory changes. No
involvement of the underlying foot musculature or fourth metatarsal
cortex. No other soft tissue masses are identified.

The bony structures are intact. No bone lesions or fractures. No
significant degenerative changes. Incidental intermetatarsal
bursitis is noted at the first 3 interspaces.

The major tendons and ligaments are intact. The foot musculature is
unremarkable.
IMPRESSION: 1. Smoothly marginated and well circumscribed 19.5 x 13.0 x 10.0 mm
solid enhancing subcutaneous soft tissue mass overlying the dorsal
aspect of the fourth metatarsal shaft. This most likely reflects a
nerve sheath tumor (schwannoma or neurofibroma). No involvement of
the underlying foot musculature or fourth metatarsal cortex.
2. No other significant findings.

## 2023-07-30 ENCOUNTER — Other Ambulatory Visit: Payer: Self-pay | Admitting: Obstetrics and Gynecology

## 2023-08-01 NOTE — Progress Notes (Unsigned)
 PCP:  Joanne Muckle, FNP   No chief complaint on file.    HPI:      [name] is a 33 y.o. G0P0000 whose LMP was No LMP recorded. Patient has had an implant., presents today for her annual examination.  Her menses are {norm/abn:715}, lasting {number: 22536} days.  Dysmenorrhea {dysmen:716}. She {does:18564} have intermenstrual bleeding. Hasss nexplanon and doing cont dosing LO Lo for cycle control. Has leio n Gyn u/s, neg pap /neg hpv 6/23  Hx of menometrorrhagia since menarche, used to be on depo with occas light bleeding but changed to nexplanon due to wt gain.   Patient denies history of migraine headaches with aura, uncontrolled hypertension, liver disease or blood clots.  Sex activity: {sex active: 315163}.  Last Pap: {CZYS:063016010}  Results were: {norm/abn:16707::"no abnormalities"} /neg HPV DNA *** Hx of STDs: {STD hx:14358}  Last mammogram: {date:304500300}  Results were: {norm/abn:13465} There is no FH of breast cancer. There is no FH of ovarian cancer. The patient {does:18564} do self-breast exams.  Tobacco use: {tob:20664} Alcohol use: {Alcohol:11675} No drug use.  Exercise: {exercise:31265}  She {does:18564} get adequate calcium and Vitamin D in her diet.  There are no active problems to display for this patient.   Past Surgical History:  Procedure Laterality Date   OTHER SURGICAL HISTORY     Tumor right leg    No family history on file.  Social History   Socioeconomic History   Marital status: Single    Spouse name: Not on file   Number of children: Not on file   Years of education: Not on file   Highest education level: Not on file  Occupational History   Not on file  Tobacco Use   Smoking status: Never   Smokeless tobacco: Never  Vaping Use   Vaping status: Never Used  Substance and Sexual Activity   Alcohol use: Never   Drug use: Never   Sexual activity: Not on file  Other Topics Concern   Not on file  Social History  Narrative   Right handed    Group [facility]    Social Drivers of Health   Financial Resource Strain: Not on file  Food Insecurity: Not on file  Transportation Needs: Not on file  Physical Activity: Not on file  Stress: Not on file  Social Connections: Not on file  Intimate Partner Violence: Not on file     Current Outpatient Medications:    albuterol (VENTOLIN HFA) 108 (90 Base) MCG/ACT inhaler, Inhale into the lungs every 6 (six) hours as needed for wheezing or shortness of breath., Disp: , Rfl:    ARIPiprazole (ABILIFY) 2 MG tablet, 1 tablet, Disp: , Rfl:    etonogestrel (NEXPLANON) 68 MG IMPL implant, See admin instructions., Disp: , Rfl:    ibuprofen  (ADVIL ) 800 MG tablet, 1 tablet Orally Three times a day prn for 30 days, Disp: , Rfl:    loratadine (CLARITIN) 10 MG tablet, 1 tablet, Disp: , Rfl:    Multiple Vitamin (THEREMS) TABS, TAKE 1 TABLET BY MOUTH ONCE DAILY, Disp: 90 tablet, Rfl: 0   Multiple Vitamins-Minerals (MULTIVITAMIN WOMEN) TABS, Take 1 tablet by mouth daily., Disp: 30 tablet, Rfl: 12   norethindrone -ethinyl estradiol (LOESTRIN  1/20, 21,) 1-20 MG-MCG tablet, Take 1 tablet by mouth daily. CONTINUOUS DOSING, Disp: 63 tablet, Rfl: 3   nystatin cream (MYCOSTATIN), 1 application Externally three times a day for 7 days, Disp: , Rfl:    traZODone (DESYREL)  50 MG tablet, Take 50 mg by mouth at bedtime., Disp: , Rfl:      ROS:  Review of Systems BREAST: No symptoms   Objective: There were no vitals taken for this visit.   OBGyn Exam  Results: No results found for this or any previous visit (from the past 24 hours).  Assessment/Plan: No diagnosis found.  No orders of the defined types were placed in this encounter.            GYN counsel {counseling: 16159}     F/U  No follow-ups on file.  Nealy Karapetian B. Esraa Seres, PA-C 08/01/2023 4:31 PM

## 2023-08-02 ENCOUNTER — Ambulatory Visit: Payer: MEDICAID | Admitting: Obstetrics and Gynecology

## 2023-08-02 ENCOUNTER — Encounter: Payer: Self-pay | Admitting: Obstetrics and Gynecology

## 2023-08-02 VITALS — BP 112/77 | HR 75 | Ht 66.0 in | Wt 258.0 lb

## 2023-08-02 DIAGNOSIS — Z3046 Encounter for surveillance of implantable subdermal contraceptive: Secondary | ICD-10-CM

## 2023-08-02 DIAGNOSIS — Z01419 Encounter for gynecological examination (general) (routine) without abnormal findings: Secondary | ICD-10-CM | POA: Diagnosis not present

## 2023-08-02 NOTE — Patient Instructions (Signed)
 I value your feedback and you entrusting [place] with your care. If you get a [place] patient survey, I would appreciate you taking the time to let us know about your experience today. Thank you! ? ? ?

## 2023-11-10 ENCOUNTER — Other Ambulatory Visit: Payer: Self-pay

## 2023-11-10 ENCOUNTER — Emergency Department: Payer: MEDICAID

## 2023-11-10 ENCOUNTER — Emergency Department
Admission: EM | Admit: 2023-11-10 | Discharge: 2023-11-10 | Disposition: A | Discharge status: Home / Self Care | Payer: MEDICAID | Attending: Emergency Medicine | Admitting: Emergency Medicine

## 2023-11-10 DIAGNOSIS — J45909 Unspecified asthma, uncomplicated: Secondary | ICD-10-CM | POA: Insufficient documentation

## 2023-11-10 DIAGNOSIS — J00 Acute nasopharyngitis [common cold]: Secondary | ICD-10-CM | POA: Diagnosis not present

## 2023-11-10 DIAGNOSIS — R059 Cough, unspecified: Secondary | ICD-10-CM | POA: Diagnosis present

## 2023-11-10 LAB — BASIC METABOLIC PANEL WITH GFR
Anion gap: 10 (ref 5–15)
BUN: 9 mg/dL (ref 6–20)
CO2: 22 mmol/L (ref 22–32)
Calcium: 8.9 mg/dL (ref 8.9–10.3)
Chloride: 107 mmol/L (ref 98–111)
Creatinine, Ser: 1.16 mg/dL — ABNORMAL HIGH (ref 0.44–1.00)
GFR, Estimated: >60 mL/min (ref >60)
Glucose, Bld: 95 mg/dL (ref 70–99)
Potassium: 4.1 mmol/L (ref 3.5–5.1)
Sodium: 139 mmol/L (ref 135–145)

## 2023-11-10 LAB — CBC
HCT: 37.8 % (ref 36.0–46.0)
Hemoglobin: 12.3 g/dL (ref 12.0–15.0)
MCH: 31 pg (ref 26.0–34.0)
MCHC: 32.5 g/dL (ref 30.0–36.0)
MCV: 95.2 fL (ref 80.0–100.0)
Platelets: 240 K/uL (ref 150–400)
RBC: 3.97 MIL/uL (ref 3.87–5.11)
RDW: 13.3 % (ref 11.5–15.5)
WBC: 5.3 K/uL (ref 4.0–10.5)
nRBC: 0 % (ref 0.0–0.2)

## 2023-11-10 LAB — TROPONIN I (HIGH SENSITIVITY): Troponin I (High Sensitivity): 3 ng/L (ref <18)

## 2023-11-10 LAB — RESP PANEL BY RT-PCR (RSV, FLU A&B, COVID)  RVPGX2
Influenza A by PCR: NEGATIVE
Influenza B by PCR: NEGATIVE
Resp Syncytial Virus by PCR: NEGATIVE
SARS Coronavirus 2 by RT PCR: NEGATIVE

## 2023-11-10 MED ORDER — CETIRIZINE HCL 10 MG PO TABS: 10.0000 mg | ORAL_TABLET | Freq: Two times a day (BID) | ORAL | 0 refills | Status: AC

## 2023-11-10 NOTE — ED Triage Notes (Signed)
 Pt to ED with caregiver for cough and runny nose since 2 days and chest pain since today. Pt lives at [facility] group home, [place] facility. Caregiver helping to answer questions.  Skin is dry, respirations unlabored.

## 2023-11-10 NOTE — ED Provider Notes (Signed)
 [facility] Provider Note    Event Date/Time   First MD Initiated Contact with Patient 11/10/23 2218     (approximate)   History   Chief Complaint: Chest Pain, Nasal Congestion, and Cough   HPI  Kelly Schroeder is a 33 y.o. female with a history of bipolar disorder, asthma who is brought to the ED from her group home due to runny nose and nonproductive cough for the last 2 days.  Also endorses some chest discomfort.  No shortness of breath.  No fever.  Eating and drinking normally and taking all her medications, no vomiting or diarrhea or dysuria.        Past Medical History:  Diagnosis Date   Asthma    Bipolar 1 disorder [facility])    Depression    Dysmenorrhea     Current Outpatient Rx   Order #: [id number] Class: Normal   Order #: [id number] Class: Historical Med   Order #: [id number] Class: Historical Med   Order #: [id number] Class: Historical Med   Order #: [id number] Class: Historical Med   Order #: [id number] Class: Historical Med   Order #: [id number] Class: Normal   Order #: [id number] Class: Normal   Order #: [id number] Class: Historical Med   Order #: [id number] Class: Historical Med    Past Surgical History:  Procedure Laterality Date   OTHER SURGICAL HISTORY     Tumor right leg    Physical Exam   Triage Vital Signs: ED Triage Vitals  Encounter Vitals Group     BP 11/10/23 1827 (!) 120/96     Girls Systolic BP Percentile --      Girls Diastolic BP Percentile --      Boys Systolic BP Percentile --      Boys Diastolic BP Percentile --      Pulse Rate 11/10/23 1827 77     Resp 11/10/23 1827 16     Temp 11/10/23 1827 98.7 F (37.1 C)     Temp Source 11/10/23 1827 Oral     SpO2 11/10/23 1827 100 %     Weight 11/10/23 1825 250 lb (113.4 kg)     Height 11/10/23 1825 5' 3 (1.6 m)     Head Circumference --      Peak Flow --      Pain Score --      Pain Loc --      Pain Education --      Exclude from Growth Chart --     Most recent  vital signs: Vitals:   11/10/23 1827  BP: (!) 120/96  Pulse: 77  Resp: 16  Temp: 98.7 F (37.1 C)  SpO2: 100%    General: Awake, no distress.  CV:  Good peripheral perfusion.  Regular rate rhythm Resp:  Normal effort.  Clear to auscultation bilaterally Abd:  No distention.  Soft nontender Other:  Moist oral mucosa.  No pharyngeal erythema.  There is watery nasal discharge with inflamed boggy turbinates   ED Results / Procedures / Treatments   Labs (all labs ordered are listed, but only abnormal results are displayed) Labs Reviewed  BASIC METABOLIC PANEL WITH GFR - Abnormal; Notable for the following components:      Result Value   Creatinine, Ser 1.16 (*)    All other components within normal limits  RESP PANEL BY RT-PCR (RSV, FLU A&B, COVID)  RVPGX2  CBC  POC URINE PREG, ED  TROPONIN I (HIGH SENSITIVITY)  TROPONIN I (HIGH SENSITIVITY)     EKG  RADIOLOGY Chest x-ray interpreted by me, negative for pneumonia.  Radiology report reviewed   PROCEDURES:  Procedures   MEDICATIONS ORDERED IN ED: Medications - No data to display   IMPRESSION / MDM / ASSESSMENT AND PLAN / ED COURSE  I reviewed the triage vital signs and the nursing notes.  DDx: COVID, influenza, pneumonia, allergic rhinitis.  Doubt ACS PE dissection  Patient's presentation is most consistent with acute presentation with potential threat to life or bodily function.  Patient presents with runny nose and nonproductive cough, nontoxic, normal vital signs, reassuring exam.  Labs unremarkable chest x-ray unremarkable.  Possible mild viral URI versus allergic rhinitis.  Recommend trial of Zyrtec .       FINAL CLINICAL IMPRESSION(S) / ED DIAGNOSES   Final diagnoses:  Acute rhinitis     Rx / DC Orders   ED Discharge Orders          Ordered    cetirizine  (ZYRTEC  ALLERGY) 10 MG tablet  2 times daily        11/10/23 2240             Note:  This document was prepared using Dragon  voice recognition software and may include unintentional dictation errors.   Viviann Pastor, MD 11/10/23 [phone]

## 2024-01-22 ENCOUNTER — Other Ambulatory Visit: Payer: Self-pay | Admitting: Obstetrics and Gynecology

## 2024-02-26 ENCOUNTER — Other Ambulatory Visit: Payer: Self-pay | Admitting: Obstetrics and Gynecology

## 2024-02-26 ENCOUNTER — Telehealth: Payer: Self-pay

## 2024-02-26 DIAGNOSIS — Z3041 Encounter for surveillance of contraceptive pills: Secondary | ICD-10-CM

## 2024-02-26 DIAGNOSIS — N921 Excessive and frequent menstruation with irregular cycle: Secondary | ICD-10-CM

## 2024-02-26 MED ORDER — NORETHINDRONE ACET-ETHINYL EST 1-20 MG-MCG PO TABS: 1.0000 | ORAL_TABLET | Freq: Every day | ORAL | 1 refills | Status: AC

## 2024-02-26 NOTE — Progress Notes (Signed)
 Rx RF OCPs for BTB with nexplanon  Meds ordered this encounter  Medications   norethindrone -ethinyl estradiol (LOESTRIN  1/20, 21,) 1-20 MG-MCG tablet    Sig: Take 1 tablet by mouth daily. CONTINUOUS DOSING    Dispense:  84 tablet    Refill:  1    Supervising Provider:   LEIGH SOBER [[id number]]

## 2024-02-26 NOTE — Telephone Encounter (Signed)
 TRIAGE VOICEMAIL: Kelly Schroeder with [name] calling to report the last time she was seen they report break thru bleeding. Now staff is stating she is bleeding heavily every month and it's lasting 9 days. Inquiring if she needs to be seen.

## 2024-02-26 NOTE — Telephone Encounter (Signed)
 Pls notify them pt can restart OCPs, Rx RF eRxd. Take continuous dosing for 3 motnhs.

## 2024-02-27 NOTE — Telephone Encounter (Signed)
 [facility] James aware.
